# Patient Record
Sex: Female | Born: 2000 | Race: Black or African American | Hispanic: No | Marital: Married | Smoking: Never smoker
Health system: Southern US, Community
[De-identification: ages and names within clinical notes are randomized; demographics above are authoritative.]

## PROBLEM LIST (undated history)

## (undated) DIAGNOSIS — J45909 Unspecified asthma, uncomplicated: Secondary | ICD-10-CM

## (undated) DIAGNOSIS — K589 Irritable bowel syndrome without diarrhea: Secondary | ICD-10-CM

## (undated) HISTORY — PX: WISDOM TOOTH EXTRACTION: SHX21

---
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
## 2019-05-31 ENCOUNTER — Encounter (HOSPITAL_COMMUNITY): Payer: Self-pay | Admitting: Emergency Medicine

## 2019-05-31 ENCOUNTER — Emergency Department (HOSPITAL_COMMUNITY)
Admission: EM | Admit: 2019-05-31 | Discharge: 2019-05-31 | Disposition: A | Discharge status: Home / Self Care | Payer: BC Managed Care – PPO | Attending: Emergency Medicine | Admitting: Emergency Medicine

## 2019-05-31 ENCOUNTER — Emergency Department (HOSPITAL_COMMUNITY): Payer: BC Managed Care – PPO

## 2019-05-31 ENCOUNTER — Other Ambulatory Visit: Payer: Self-pay

## 2019-05-31 DIAGNOSIS — R1031 Right lower quadrant pain: Secondary | ICD-10-CM

## 2019-05-31 DIAGNOSIS — Z881 Allergy status to other antibiotic agents status: Secondary | ICD-10-CM | POA: Insufficient documentation

## 2019-05-31 DIAGNOSIS — Z79899 Other long term (current) drug therapy: Secondary | ICD-10-CM | POA: Diagnosis not present

## 2019-05-31 DIAGNOSIS — B373 Candidiasis of vulva and vagina: Secondary | ICD-10-CM | POA: Diagnosis not present

## 2019-05-31 DIAGNOSIS — N898 Other specified noninflammatory disorders of vagina: Secondary | ICD-10-CM

## 2019-05-31 DIAGNOSIS — Z880 Allergy status to penicillin: Secondary | ICD-10-CM | POA: Insufficient documentation

## 2019-05-31 DIAGNOSIS — J45909 Unspecified asthma, uncomplicated: Secondary | ICD-10-CM | POA: Insufficient documentation

## 2019-05-31 DIAGNOSIS — B379 Candidiasis, unspecified: Secondary | ICD-10-CM

## 2019-05-31 DIAGNOSIS — Z882 Allergy status to sulfonamides status: Secondary | ICD-10-CM | POA: Insufficient documentation

## 2019-05-31 DIAGNOSIS — R110 Nausea: Secondary | ICD-10-CM

## 2019-05-31 LAB — CBC
HCT: 35.5 % — ABNORMAL LOW (ref 36.0–46.0)
Hemoglobin: 10.6 g/dL — ABNORMAL LOW (ref 12.0–15.0)
MCH: 23 pg — ABNORMAL LOW (ref 26.0–34.0)
MCHC: 29.9 g/dL — ABNORMAL LOW (ref 30.0–36.0)
MCV: 77 fL — ABNORMAL LOW (ref 80.0–100.0)
Platelets: 413 10*3/uL — ABNORMAL HIGH (ref 150–400)
RBC: 4.61 MIL/uL (ref 3.87–5.11)
RDW: 17.5 % — ABNORMAL HIGH (ref 11.5–15.5)
WBC: 6.1 10*3/uL (ref 4.0–10.5)
nRBC: 0 % (ref 0.0–0.2)

## 2019-05-31 LAB — COMPREHENSIVE METABOLIC PANEL
ALT: 17 U/L (ref 0–44)
AST: 18 U/L (ref 15–41)
Albumin: 3.7 g/dL (ref 3.5–5.0)
Alkaline Phosphatase: 61 U/L (ref 38–126)
Anion gap: 8 (ref 5–15)
BUN: 10 mg/dL (ref 6–20)
CO2: 25 mmol/L (ref 22–32)
Calcium: 8.9 mg/dL (ref 8.9–10.3)
Chloride: 104 mmol/L (ref 98–111)
Creatinine, Ser: 0.55 mg/dL (ref 0.44–1.00)
GFR calc Af Amer: >60 mL/min (ref >60)
GFR calc non Af Amer: >60 mL/min (ref >60)
Glucose, Bld: 101 mg/dL — ABNORMAL HIGH (ref 70–99)
Potassium: 3.8 mmol/L (ref 3.5–5.1)
Sodium: 137 mmol/L (ref 135–145)
Total Bilirubin: 0.4 mg/dL (ref 0.3–1.2)
Total Protein: 7.4 g/dL (ref 6.5–8.1)

## 2019-05-31 LAB — URINALYSIS, ROUTINE W REFLEX MICROSCOPIC
Bilirubin Urine: NEGATIVE
Glucose, UA: NEGATIVE mg/dL
Hgb urine dipstick: NEGATIVE
Ketones, ur: NEGATIVE mg/dL
Leukocytes,Ua: NEGATIVE
Nitrite: NEGATIVE
Protein, ur: NEGATIVE mg/dL
Specific Gravity, Urine: 1.026 (ref 1.005–1.030)
pH: 6 (ref 5.0–8.0)

## 2019-05-31 LAB — WET PREP, GENITAL
Clue Cells Wet Prep HPF POC: NONE SEEN
Sperm: NONE SEEN
Trich, Wet Prep: NONE SEEN

## 2019-05-31 LAB — I-STAT BETA HCG BLOOD, ED (MC, WL, AP ONLY): I-stat hCG, quantitative: <5 m[IU]/mL (ref <5)

## 2019-05-31 LAB — LIPASE, BLOOD: Lipase: 23 U/L (ref 11–51)

## 2019-05-31 MED ORDER — SODIUM CHLORIDE 0.9% FLUSH: 3.0000 mL | Freq: Once | INTRAVENOUS | Status: DC

## 2019-05-31 MED ORDER — IOHEXOL 300 MG/ML  SOLN
100.0000 mL | Freq: Once | INTRAMUSCULAR | Status: AC | PRN
  Administered 2019-05-31: 100 mL via INTRAVENOUS

## 2019-05-31 MED ORDER — FLUCONAZOLE 150 MG PO TABS: ORAL_TABLET | ORAL | 0 refills | Status: AC

## 2019-05-31 MED ORDER — SODIUM CHLORIDE 0.9 % IV BOLUS
1000.0000 mL | Freq: Once | INTRAVENOUS | Status: AC
  Administered 2019-05-31: 1000 mL via INTRAVENOUS

## 2019-05-31 NOTE — Discharge Instructions (Addendum)
Your labwork and imaging were reassuring today. You do have a yeast infection today - please take medication as prescribed. If you continue to have discharge 3 days after taking the first dose please take the second dose.   Please follow up with your PCP for reevaluation in about 1 week.   Return to the ED for any worsening symptoms.

## 2019-05-31 NOTE — ED Provider Notes (Signed)
[facility] Note   CSN: [id number] Arrival date & time: 05/31/19  1431     History Chief Complaint  Patient presents with  . Abdominal Pain  . Nausea    Meghan Bailey is a 19 y.o. female who presents to the ED today complaining of sudden onset, constant, dull/achy, 7/10, RLQ abdominal pain that began this morning around 7:30 AM. Pt also complains of nausea, no emesis. Pt went to Urgent Care earlier today and was sent here for further evaluation with concern for possible appendicitis. Pt late ate/drank around 10-11 AM this morning. No previous abdominal surgeries. Denies fever, chills, diarrhea, constipation, urinary sx, pelvic pain. She does mention noticing thin clear vaginal discharge yesterday but none today. Pt is not sexually active, reports being a virgin.   The history is provided by the patient and medical records.       Past Medical History:  Diagnosis Date  . Asthma   . IBS (irritable bowel syndrome)     There are no problems to display for this patient.   History reviewed. No pertinent surgical history.   OB History   No obstetric history on file.     No family history on file.  Social History   Tobacco Use  . Smoking status: Never Smoker  . Smokeless tobacco: Never Used  Substance Use Topics  . Alcohol use: Never  . Drug use: Never    Home Medications Prior to Admission medications   Medication Sig Start Date End Date Taking? Authorizing Provider  hydrOXYzine (ATARAX/VISTARIL) 10 MG tablet Take 10 mg by mouth 3 (three) times daily as needed for anxiety.   Yes [provider]  sertraline (ZOLOFT) 100 MG tablet Take 100 mg by mouth daily.   Yes [provider]  fluconazole (DIFLUCAN) 150 MG tablet Take 1 tablet by mouth on day 1. If you continue to have symptoms take 1 tablet by mouth on day 4. 05/31/19   Hyman Hopes, Katelee Schupp, PA-C  loratadine (CLARITIN) 10 MG tablet Take 1 tablet (10 mg  total) by mouth daily. Patient not taking: Reported on 05/31/2019 07/17/17   Janne Napoleon, NP  ondansetron (ZOFRAN ODT) 4 MG disintegrating tablet Take 1 tablet (4 mg total) by mouth every 8 (eight) hours as needed for nausea or vomiting. Patient not taking: Reported on 05/31/2019 12/29/17   Janne Napoleon, NP  ranitidine (ZANTAC) 150 MG tablet Take 1 tablet (150 mg total) by mouth 2 (two) times daily. Patient not taking: Reported on 05/31/2019 12/29/17   Janne Napoleon, NP    Allergies    Clindamycin/lincomycin, Keflex [cephalexin], Tessalon [benzonatate], Penicillins, and Sulfa antibiotics  Review of Systems   Review of Systems  Constitutional: Negative for chills and fever.  Respiratory: Negative for cough and shortness of breath.   Cardiovascular: Negative for chest pain.  Gastrointestinal: Positive for abdominal pain and nausea. Negative for constipation, diarrhea and vomiting.  Genitourinary: Positive for vaginal discharge (resolved). Negative for difficulty urinating, flank pain, frequency and pelvic pain.  All other systems reviewed and are negative.   Physical Exam Updated Vital Signs BP 134/80 (BP Location: Right Arm)   Pulse 79   Temp 98.3 F (36.8 C) (Oral)   Resp 19   SpO2 100%   Physical Exam Vitals and nursing note reviewed.  Constitutional:      Appearance: She is not ill-appearing or diaphoretic.  HENT:     Head: Normocephalic and atraumatic.  Eyes:  Conjunctiva/sclera: Conjunctivae normal.  Cardiovascular:     Rate and Rhythm: Normal rate and regular rhythm.     Heart sounds: Normal heart sounds.  Pulmonary:     Effort: Pulmonary effort is normal.     Breath sounds: Normal breath sounds. No wheezing or rhonchi.  Chest:     Chest wall: No tenderness.  Abdominal:     General: Abdomen is flat.     Palpations: Abdomen is soft.     Tenderness: There is abdominal tenderness in the right lower quadrant. There is no right CVA tenderness, left CVA tenderness,  guarding or rebound. Positive signs include Rovsing's sign and McBurney's sign. Negative signs include psoas sign and obturator sign.  Genitourinary:    Comments: Chaperone present for exam Vivia Ewing, NT No rashes, lesions, or tenderness to external genitalia. No erythema, injury, or tenderness to vaginal mucosa. Thin white discharge noted in vault. + Right adnexal TTP. No CMT, cervical friability, or discharge from cervical os. Cervical os is closed. Uterus non-deviated, mobile, nonTTP, and without enlargement.  Musculoskeletal:     Cervical back: Neck supple.  Skin:    General: Skin is warm and dry.  Neurological:     Mental Status: She is alert.     ED Results / Procedures / Treatments   Labs (all labs ordered are listed, but only abnormal results are displayed) Labs Reviewed  WET PREP, GENITAL - Abnormal; Notable for the following components:      Result Value   Yeast Wet Prep HPF POC PRESENT (*)    WBC, Wet Prep HPF POC FEW (*)    All other components within normal limits  COMPREHENSIVE METABOLIC PANEL - Abnormal; Notable for the following components:   Glucose, Bld 101 (*)    All other components within normal limits  CBC - Abnormal; Notable for the following components:   Hemoglobin 10.6 (*)    HCT 35.5 (*)    MCV 77.0 (*)    MCH 23.0 (*)    MCHC 29.9 (*)    RDW 17.5 (*)    Platelets 413 (*)    All other components within normal limits  LIPASE, BLOOD  URINALYSIS, ROUTINE W REFLEX MICROSCOPIC  I-STAT BETA HCG BLOOD, ED ([facility], [facility], [facility] ONLY)  GC/CHLAMYDIA PROBE AMP ([place]) NOT AT [facility]    EKG None  Radiology CT Abdomen Pelvis W Contrast  Result Date: 05/31/2019 CLINICAL DATA:  Right lower quadrant abdominal pain since this morning EXAM: CT ABDOMEN AND PELVIS WITH CONTRAST TECHNIQUE: Multidetector CT imaging of the abdomen and pelvis was performed using the standard protocol following bolus administration of intravenous contrast. CONTRAST:  OMNIPAQUE  IOHEXOL 300 MG/ML  SOLN COMPARISON:  None. FINDINGS: Lower chest: No acute pleural or parenchymal lung disease. Hepatobiliary: No focal liver abnormality is seen. No gallstones, gallbladder wall thickening, or biliary dilatation. Pancreas: Unremarkable. No pancreatic ductal dilatation or surrounding inflammatory changes. Spleen: Normal in size without focal abnormality. Adrenals/Urinary Tract: Adrenal glands are unremarkable. Kidneys are normal, without renal calculi, focal lesion, or hydronephrosis. Bladder is unremarkable. Stomach/Bowel: Stomach is within normal limits. Appendix appears normal. No evidence of bowel wall thickening, distention, or inflammatory changes. Vascular/Lymphatic: No significant vascular findings are present. No enlarged abdominal or pelvic lymph nodes. Reproductive: Uterus and bilateral adnexa are unremarkable. Other: Trace pelvic free fluid is likely physiologic. No evidence of abdominal wall hernia. Musculoskeletal: No acute or destructive bony lesions. Reconstructed images demonstrate no additional findings. IMPRESSION: 1. No acute intra-abdominal or  intrapelvic process. Normal appendix. 2. Trace pelvic free fluid is likely physiologic in this premenopausal patient. Electronically Signed   By: Sharlet Salina M.D.   On: 05/31/2019 17:47   US PELVIC COMPLETE W TRANSVAGINAL AND TORSION R/O  Result Date: 05/31/2019 CLINICAL DATA:  Right lower quadrant pain x1 day EXAM: TRANSABDOMINAL AND TRANSVAGINAL ULTRASOUND OF PELVIS DOPPLER ULTRASOUND OF OVARIES TECHNIQUE: Both transabdominal and transvaginal ultrasound examinations of the pelvis were performed. Transabdominal technique was performed for global imaging of the pelvis including uterus, ovaries, adnexal regions, and pelvic cul-de-sac. It was necessary to proceed with endovaginal exam following the transabdominal exam to visualize the endometrium and right ovary. Color and duplex Doppler ultrasound was utilized to evaluate blood flow  to the ovaries. COMPARISON:  None. FINDINGS: Uterus Measurements: 8.9 x 4.9 x 5.3 cm = volume: 119 mL. No fibroids or other mass visualized. Endometrium Thickness: 15 mm.  No focal abnormality visualized. Right ovary Measurements: 5.0 x 2.7 x 3.0 cm = volume: 20.5 mL. Normal appearance/no adnexal mass. Left ovary Measurements: 2.7 x 2.1 x 2.6 cm = volume: 7.6 mL. Normal appearance/no adnexal mass. Pulsed Doppler evaluation of both ovaries demonstrates normal low-resistance arterial and venous waveforms. Other findings No abnormal free fluid. IMPRESSION: Negative pelvic ultrasound. No evidence of ovarian torsion. Electronically Signed   By: Charline Bills M.D.   On: 05/31/2019 19:00    Procedures Procedures (including critical care time)  Medications Ordered in ED Medications  sodium chloride flush (NS) 0.9 % injection 3 mL (has no administration in time range)  sodium chloride 0.9 % bolus 1,000 mL (1,000 mLs Intravenous New Bag/Given (Non-Interop) 05/31/19 1720)  iohexol (OMNIPAQUE) 300 MG/ML solution 100 mL (100 mLs Intravenous Contrast Given 05/31/19 1731)    ED Course  I have reviewed the triage vital signs and the nursing notes.  Pertinent labs & imaging results that were available during my care of the patient were reviewed by me and considered in my medical decision making (see chart for details).  19 year old female who presents to the ED today sent from urgent care with concern for appendicitis. Pt has been having RLQ abd pain that began this morning with nausea, no vomiting. On arrival to the ED pt is afebrile, nontachycardic, and nontachypneic. Pt does have some RLQ TTP on exam with + mcburney's and rovsing's sign. Will obtain CT A/P at this time. Pt is not sexually active but does report vaginal discharge that she noticed yesterday, denies pelvic pain. If no acute findings on CT scan will perform pelvic exam and pelvic ultrasound.   CT scan negative for appendicitis. Pelvic exam  performed with vaginal discharge present; awaiting wet prep results.   Pelvic ultrasound without signs concerning for torsion. Wet prep positive for yeast - will treat accordingly. This may very well be causing pt's discomfort. She reports her abdominal pain has improved since being in the ED. Will have her follow up outpatient with her PCP for reevaluation in 1 week. Strict return precautions discussed. Pt is in agreement with plan and stable for discharge home.   Clinical Course as of May 30 1928  Mon May 31, 2019  1904 Yeast Wet Prep HPF POC(!): PRESENT [MV]    Clinical Course User Index [MV] Tanda Rockers, PA-C    This note was prepared using Dragon voice recognition software and may include unintentional dictation errors due to the inherent limitations of voice recognition software.  MDM Rules/Calculators/A&P  Final Clinical Impression(s) / ED Diagnoses Final diagnoses:  Yeast infection  Right lower quadrant abdominal pain  Nausea  Vaginal discharge    Rx / DC Orders ED Discharge Orders         Ordered    fluconazole (DIFLUCAN) 150 MG tablet     05/31/19 1928           Discharge Instructions     Your labwork and imaging were reassuring today. You do have a yeast infection today - please take medication as prescribed. If you continue to have discharge 3 days after taking the first dose please take the second dose.   Please follow up with your PCP for reevaluation in about 1 week.   Return to the ED for any worsening symptoms.        Eustaquio Maize, PA-C 05/31/19 [place], [place], MD 06/02/19 1724

## 2019-05-31 NOTE — ED Triage Notes (Signed)
Patient here from urgent care with complaints of right lower quad abd pain that started this morning. Nausea, no vomiting. Denies pregnancy.

## 2019-06-02 LAB — GC/CHLAMYDIA PROBE AMP (~~LOC~~) NOT AT ARMC
Chlamydia: NEGATIVE
Neisseria Gonorrhea: NEGATIVE

## 2019-06-21 ENCOUNTER — Ambulatory Visit: Payer: BC Managed Care – PPO | Attending: Internal Medicine

## 2019-06-21 DIAGNOSIS — Z23 Encounter for immunization: Secondary | ICD-10-CM | POA: Insufficient documentation

## 2019-06-21 NOTE — Progress Notes (Signed)
   Covid-19 Vaccination Clinic  Name:  Heide Brossart    MRN: [id number] DOB: Feb 22, 2001  06/21/2019  Ms. Nayak was observed post Covid-19 immunization for 15 minutes without incident. She was provided with Vaccine Information Sheet and instruction to access the V-Safe system.   Ms. Boghosian was instructed to call 911 with any severe reactions post vaccine: Marland Kitchen Difficulty breathing  . Swelling of face and throat  . A fast heartbeat  . A bad rash all over body  . Dizziness and weakness   Immunizations Administered    Name Date Dose VIS Date Route   Pfizer COVID-19 Vaccine 06/21/2019  9:32 AM 0.3 mL 03/26/2019 Intramuscular   Manufacturer: [employer], [employer]   Lot: [id number]   NDC: [address]-1

## 2019-07-21 ENCOUNTER — Ambulatory Visit: Payer: BC Managed Care – PPO | Attending: Internal Medicine

## 2019-07-21 DIAGNOSIS — Z23 Encounter for immunization: Secondary | ICD-10-CM

## 2019-07-21 NOTE — Progress Notes (Signed)
   Covid-19 Vaccination Clinic  Name:  Bo Rogue    MRN: [id number] DOB: [birth year]  07/21/2019  Ms. Seelye was observed post Covid-19 immunization for 15 minutes without incident. She was provided with Vaccine Information Sheet and instruction to access the V-Safe system.   Ms. Petre was instructed to call 911 with any severe reactions post vaccine: Marland Kitchen Difficulty breathing  . Swelling of face and throat  . A fast heartbeat  . A bad rash all over body  . Dizziness and weakness   Immunizations Administered    Name Date Dose VIS Date Route   Pfizer COVID-19 Vaccine 07/21/2019  2:06 PM 0.3 mL 03/26/2019 Intramuscular   Manufacturer: [employer], [employer]   Lot: [id number]   NDC: [address]-1

## 2019-08-03 ENCOUNTER — Other Ambulatory Visit: Payer: Self-pay

## 2019-08-03 ENCOUNTER — Encounter (HOSPITAL_COMMUNITY): Payer: Self-pay

## 2019-08-03 ENCOUNTER — Emergency Department (HOSPITAL_COMMUNITY): Payer: BC Managed Care – PPO

## 2019-08-03 ENCOUNTER — Emergency Department (HOSPITAL_COMMUNITY)
Admission: EM | Admit: 2019-08-03 | Discharge: 2019-08-03 | Disposition: A | Discharge status: Home / Self Care | Payer: BC Managed Care – PPO | Attending: Emergency Medicine | Admitting: Emergency Medicine

## 2019-08-03 DIAGNOSIS — J45909 Unspecified asthma, uncomplicated: Secondary | ICD-10-CM | POA: Insufficient documentation

## 2019-08-03 DIAGNOSIS — R1310 Dysphagia, unspecified: Secondary | ICD-10-CM | POA: Diagnosis present

## 2019-08-03 DIAGNOSIS — Z79899 Other long term (current) drug therapy: Secondary | ICD-10-CM | POA: Insufficient documentation

## 2019-08-03 DIAGNOSIS — J029 Acute pharyngitis, unspecified: Secondary | ICD-10-CM | POA: Diagnosis not present

## 2019-08-03 LAB — CBC WITH DIFFERENTIAL/PLATELET
Abs Immature Granulocytes: 0.03 10*3/uL (ref 0.00–0.07)
Basophils Absolute: 0.1 10*3/uL (ref 0.0–0.1)
Basophils Relative: 0 %
Eosinophils Absolute: 0 10*3/uL (ref 0.0–0.5)
Eosinophils Relative: 0 %
HCT: 33.8 % — ABNORMAL LOW (ref 36.0–46.0)
Hemoglobin: 10.4 g/dL — ABNORMAL LOW (ref 12.0–15.0)
Immature Granulocytes: 0 %
Lymphocytes Relative: 12 %
Lymphs Abs: 1.5 10*3/uL (ref 0.7–4.0)
MCH: 23.2 pg — ABNORMAL LOW (ref 26.0–34.0)
MCHC: 30.8 g/dL (ref 30.0–36.0)
MCV: 75.4 fL — ABNORMAL LOW (ref 80.0–100.0)
Monocytes Absolute: 0.7 10*3/uL (ref 0.1–1.0)
Monocytes Relative: 6 %
Neutro Abs: 9.6 10*3/uL — ABNORMAL HIGH (ref 1.7–7.7)
Neutrophils Relative %: 82 %
Platelets: 452 10*3/uL — ABNORMAL HIGH (ref 150–400)
RBC: 4.48 MIL/uL (ref 3.87–5.11)
RDW: 16.1 % — ABNORMAL HIGH (ref 11.5–15.5)
WBC: 11.9 10*3/uL — ABNORMAL HIGH (ref 4.0–10.5)
nRBC: 0 % (ref 0.0–0.2)

## 2019-08-03 LAB — BASIC METABOLIC PANEL
Anion gap: 10 (ref 5–15)
BUN: 7 mg/dL (ref 6–20)
CO2: 26 mmol/L (ref 22–32)
Calcium: 8.7 mg/dL — ABNORMAL LOW (ref 8.9–10.3)
Chloride: 103 mmol/L (ref 98–111)
Creatinine, Ser: 0.63 mg/dL (ref 0.44–1.00)
GFR calc Af Amer: >60 mL/min (ref >60)
GFR calc non Af Amer: >60 mL/min (ref >60)
Glucose, Bld: 101 mg/dL — ABNORMAL HIGH (ref 70–99)
Potassium: 3.7 mmol/L (ref 3.5–5.1)
Sodium: 139 mmol/L (ref 135–145)

## 2019-08-03 MED ORDER — DEXAMETHASONE SODIUM PHOSPHATE 10 MG/ML IJ SOLN
10.0000 mg | Freq: Once | INTRAMUSCULAR | Status: AC
  Administered 2019-08-03: 21:00:00 10 mg via INTRAVENOUS
  Filled 2019-08-03: qty 1

## 2019-08-03 MED ORDER — SODIUM CHLORIDE (PF) 0.9 % IJ SOLN
INTRAMUSCULAR | Status: AC
  Filled 2019-08-03: qty 50

## 2019-08-03 MED ORDER — SODIUM CHLORIDE 0.9 % IV BOLUS
1000.0000 mL | Freq: Once | INTRAVENOUS | Status: AC
  Administered 2019-08-03: 1000 mL via INTRAVENOUS

## 2019-08-03 MED ORDER — IOHEXOL 300 MG/ML  SOLN
100.0000 mL | Freq: Once | INTRAMUSCULAR | Status: AC | PRN
  Administered 2019-08-03: 100 mL via INTRAVENOUS

## 2019-08-03 NOTE — Discharge Instructions (Addendum)
Your CT scan shows normal postsurgical changes.  It is important to hydrate with water while you are at home.  However fluids given today in addition to the steroids will help improve your symptoms over the next 24 hours.  Return to the ER for worsening or concerning symptoms.

## 2019-08-03 NOTE — ED Triage Notes (Signed)
Patient states she had her wisdom teeth taken out 4 days ago. Patient has facial swelling. Patient states she is having difficulty swallowing on the right side. Patient talking in complete sentences and sats 1005.

## 2019-08-03 NOTE — ED Provider Notes (Signed)
[facility] DEPT Provider Note   CSN: [id number] Arrival date & time: 08/03/19  1546     History Chief Complaint  Patient presents with  . Dysphagia    Meghan Bailey is a 19 y.o. female.  19 year old female presents with complaint of right-sided sore throat with painful swallowing and difficulty opening her mouth.  Patient had her wisdom teeth removed 4 days ago, took a 4-day course of ibuprofen and hydrocodone, was not prescribed antibiotics.  Patient denies sick contacts, fevers, changes in voice or difficulty breathing or swallowing.  No other complaints or concerns.        Past Medical History:  Diagnosis Date  . Asthma   . IBS (irritable bowel syndrome)     There are no problems to display for this patient.   Past Surgical History:  Procedure Laterality Date  . WISDOM TOOTH EXTRACTION       OB History   No obstetric history on file.     Family History  Problem Relation Age of Onset  . Hypertension Mother   . Hypertension Father     Social History   Tobacco Use  . Smoking status: Never Smoker  . Smokeless tobacco: Never Used  Substance Use Topics  . Alcohol use: Never  . Drug use: Never    Home Medications Prior to Admission medications   Medication Sig Start Date End Date Taking? Authorizing Provider  ibuprofen (ADVIL) 600 MG tablet Take 600 mg by mouth every 6 (six) hours as needed for moderate pain.   Yes [provider]  fluconazole (DIFLUCAN) 150 MG tablet Take 1 tablet by mouth on day 1. If you continue to have symptoms take 1 tablet by mouth on day 4. Patient not taking: Reported on 08/03/2019 05/31/19   Eustaquio Maize, PA-C  loratadine (CLARITIN) 10 MG tablet Take 1 tablet (10 mg total) by mouth daily. Patient not taking: Reported on 05/31/2019 07/17/17   Ashley Murrain, NP  ondansetron (ZOFRAN ODT) 4 MG disintegrating tablet Take 1 tablet (4 mg total) by mouth every 8 (eight) hours as needed for  nausea or vomiting. Patient not taking: Reported on 05/31/2019 12/29/17   Ashley Murrain, NP  ranitidine (ZANTAC) 150 MG tablet Take 1 tablet (150 mg total) by mouth 2 (two) times daily. Patient not taking: Reported on 05/31/2019 12/29/17   Ashley Murrain, NP    Allergies    Amoxicillin, Clindamycin/lincomycin, Keflex [cephalexin], Tessalon [benzonatate], Penicillins, and Sulfa antibiotics  Review of Systems   Review of Systems  Constitutional: Negative for chills and fever.  HENT: Positive for dental problem and sore throat. Negative for trouble swallowing and voice change.   Respiratory: Negative for cough and shortness of breath.   Gastrointestinal: Negative for nausea and vomiting.  Skin: Negative for rash and wound.  Allergic/Immunologic: Negative for immunocompromised state.  Hematological: Negative for adenopathy.  All other systems reviewed and are negative.   Physical Exam Updated Vital Signs BP 135/71   Pulse 96   Temp 98 F (36.7 C) (Oral)   Resp 16   Ht 5\' 6"  (1.676 m)   Wt 99.8 kg   LMP 07/15/2019   SpO2 100%   BMI 35.51 kg/m   Physical Exam Vitals and nursing note reviewed.  Constitutional:      General: She is not in acute distress.    Appearance: She is well-developed. She is not diaphoretic.  HENT:     Head: Normocephalic and atraumatic.  Jaw: Trismus present.     Comments: Found to have trismus, able to open mouth to 1 fingerbreadth.  Uvula is midline, no peritonsillar swelling.  No sublingual swelling or tenderness.  There is submandibular fullness on both sides.    Mouth/Throat:     Mouth: Mucous membranes are dry.     Pharynx: No oropharyngeal exudate or posterior oropharyngeal erythema.  Cardiovascular:     Rate and Rhythm: Normal rate and regular rhythm.     Pulses: Normal pulses.     Heart sounds: Normal heart sounds.  Pulmonary:     Effort: Pulmonary effort is normal.     Breath sounds: Normal breath sounds.  Musculoskeletal:     Cervical  back: Tenderness present.  Skin:    General: Skin is warm and dry.     Findings: No erythema or rash.  Neurological:     Mental Status: She is alert and oriented to person, place, and time.  Psychiatric:        Behavior: Behavior normal.     ED Results / Procedures / Treatments   Labs (all labs ordered are listed, but only abnormal results are displayed) Labs Reviewed  CBC WITH DIFFERENTIAL/PLATELET - Abnormal; Notable for the following components:      Result Value   WBC 11.9 (*)    Hemoglobin 10.4 (*)    HCT 33.8 (*)    MCV 75.4 (*)    MCH 23.2 (*)    RDW 16.1 (*)    Platelets 452 (*)    Neutro Abs 9.6 (*)    All other components within normal limits  BASIC METABOLIC PANEL - Abnormal; Notable for the following components:   Glucose, Bld 101 (*)    Calcium 8.7 (*)    All other components within normal limits    EKG None  Radiology DG Neck Soft Tissue  Result Date: 08/03/2019 CLINICAL DATA:  19 year old female with difficulty swallowing. Status post dental extraction. EXAM: NECK SOFT TISSUES - 1+ VIEW COMPARISON:  None. FINDINGS: There is no evidence of retropharyngeal soft tissue swelling or epiglottic enlargement. The cervical airway is unremarkable and no radio-opaque foreign body identified. IMPRESSION: Negative. Electronically Signed   By: Elgie Collard M.D.   On: 08/03/2019 18:32   CT Soft Tissue Neck W Contrast  Result Date: 08/03/2019 CLINICAL DATA:  Painful swallowing. Recent dental extraction. EXAM: CT NECK WITH CONTRAST TECHNIQUE: Multidetector CT imaging of the neck was performed using the standard protocol following the bolus administration of intravenous contrast. CONTRAST:  OMNIPAQUE IOHEXOL 300 MG/ML  SOLN COMPARISON:  None. FINDINGS: Pharynx and larynx: Normal. No mass or swelling. Salivary glands: Right submandibular gland is enlarged. There is mild adjacent inflammatory stranding. Thyroid: Normal Lymph nodes: No enlarged or abnormal density nodes  Vascular: Normal Limited intracranial: Normal Visualized orbits: Normal Mastoids and visualized paranasal sinuses: Left maxillary sinus retention cyst Skeleton: No acute or aggressive process. Upper chest: Negative. Other: Postsurgical changes recent extraction of the third maxillary and mandibular molars on the right and the third maxillary molar on the left. IMPRESSION: Enlarged right submandibular gland with mild adjacent inflammatory stranding, likely indicating mild cellulitis in the setting of recent third molar extractions. No abscess or drainable fluid collection. Electronically Signed   By: Deatra Robinson M.D.   On: 08/03/2019 23:25    Procedures Procedures (including critical care time)  Medications Ordered in ED Medications  sodium chloride (PF) 0.9 % injection (has no administration in time range)  sodium chloride 0.9 %  bolus 1,000 mL (0 mLs Intravenous Stopped 08/03/19 2331)  dexamethasone (DECADRON) injection 10 mg (10 mg Intravenous Given 08/03/19 2046)  iohexol (OMNIPAQUE) 300 MG/ML solution 100 mL (100 mLs Intravenous Contrast Given 08/03/19 2257)    ED Course  I have reviewed the triage vital signs and the nursing notes.  Pertinent labs & imaging results that were available during my care of the patient were reviewed by me and considered in my medical decision making (see chart for details).  Clinical Course as of Aug 03 2343  Tue Aug 03, 2019  1871 19 year old female presents with complaint of sore throat and swelling post dental extraction 4 days ago. On exam patient does have trismus, able to open mouth one finger with. There is no sublingual swelling, she does seem to have submandibular fullness, no evidence of peritonsillar abscess. Patient was given IV fluids and Decadron. Labs with mildly increased white count 11.9, hemoglobin of 10.4, appears chronic. BMP unremarkable. Patient is afebrile and tolerating secretions. CT with postop changes, no abscess at this time. On  recheck, patient is feeling somewhat improved after fluids and Decadron. Results were discussed with patient's mother with her permission. Recommend home to rest and hydrate, take Motrin Tylenol, if unable to swallow pills may use children's liquid preparation. Return to ER for fever, difficulty swallowing/drooling, any worsening or concerning symptoms.   [LM]    Clinical Course User Index [LM] Alden Hipp   MDM Rules/Calculators/A&P                      Final Clinical Impression(s) / ED Diagnoses Final diagnoses:  Pharyngitis, unspecified etiology    Rx / DC Orders ED Discharge Orders    None       Alden Hipp 08/03/19 2345    Bethann Berkshire, MD 08/05/19 0021

## 2019-12-15 ENCOUNTER — Other Ambulatory Visit: Payer: Medicaid Other

## 2020-02-16 ENCOUNTER — Ambulatory Visit: Payer: Medicaid Other | Attending: Family

## 2020-02-16 DIAGNOSIS — Z23 Encounter for immunization: Secondary | ICD-10-CM

## 2020-03-05 ENCOUNTER — Other Ambulatory Visit: Payer: Self-pay

## 2020-03-05 ENCOUNTER — Emergency Department (HOSPITAL_COMMUNITY): Payer: BC Managed Care – PPO

## 2020-03-05 ENCOUNTER — Emergency Department (HOSPITAL_COMMUNITY)
Admission: EM | Admit: 2020-03-05 | Discharge: 2020-03-05 | Disposition: A | Discharge status: Home / Self Care | Payer: BC Managed Care – PPO | Attending: Emergency Medicine | Admitting: Emergency Medicine

## 2020-03-05 ENCOUNTER — Encounter (HOSPITAL_COMMUNITY): Payer: Self-pay | Admitting: Emergency Medicine

## 2020-03-05 DIAGNOSIS — K589 Irritable bowel syndrome without diarrhea: Secondary | ICD-10-CM | POA: Insufficient documentation

## 2020-03-05 DIAGNOSIS — R1011 Right upper quadrant pain: Secondary | ICD-10-CM | POA: Diagnosis not present

## 2020-03-05 DIAGNOSIS — Z881 Allergy status to other antibiotic agents status: Secondary | ICD-10-CM | POA: Insufficient documentation

## 2020-03-05 DIAGNOSIS — R110 Nausea: Secondary | ICD-10-CM | POA: Diagnosis not present

## 2020-03-05 DIAGNOSIS — Z79899 Other long term (current) drug therapy: Secondary | ICD-10-CM | POA: Diagnosis not present

## 2020-03-05 DIAGNOSIS — Z880 Allergy status to penicillin: Secondary | ICD-10-CM | POA: Insufficient documentation

## 2020-03-05 DIAGNOSIS — Z882 Allergy status to sulfonamides status: Secondary | ICD-10-CM | POA: Diagnosis not present

## 2020-03-05 DIAGNOSIS — R1013 Epigastric pain: Secondary | ICD-10-CM | POA: Diagnosis not present

## 2020-03-05 DIAGNOSIS — D649 Anemia, unspecified: Secondary | ICD-10-CM | POA: Insufficient documentation

## 2020-03-05 LAB — URINALYSIS, ROUTINE W REFLEX MICROSCOPIC
Bacteria, UA: NONE SEEN
Bilirubin Urine: NEGATIVE
Glucose, UA: NEGATIVE mg/dL
Ketones, ur: NEGATIVE mg/dL
Leukocytes,Ua: NEGATIVE
Nitrite: NEGATIVE
Protein, ur: NEGATIVE mg/dL
Specific Gravity, Urine: 1.015 (ref 1.005–1.030)
pH: 6 (ref 5.0–8.0)

## 2020-03-05 LAB — CBC
HCT: 33.7 % — ABNORMAL LOW (ref 36.0–46.0)
Hemoglobin: 10.1 g/dL — ABNORMAL LOW (ref 12.0–15.0)
MCH: 21.8 pg — ABNORMAL LOW (ref 26.0–34.0)
MCHC: 30 g/dL (ref 30.0–36.0)
MCV: 72.8 fL — ABNORMAL LOW (ref 80.0–100.0)
Platelets: 419 10*3/uL — ABNORMAL HIGH (ref 150–400)
RBC: 4.63 MIL/uL (ref 3.87–5.11)
RDW: 18.2 % — ABNORMAL HIGH (ref 11.5–15.5)
WBC: 5.7 10*3/uL (ref 4.0–10.5)
nRBC: 0 % (ref 0.0–0.2)

## 2020-03-05 LAB — I-STAT BETA HCG BLOOD, ED (MC, WL, AP ONLY): I-stat hCG, quantitative: <5 m[IU]/mL (ref <5)

## 2020-03-05 LAB — COMPREHENSIVE METABOLIC PANEL
ALT: 16 U/L (ref 0–44)
AST: 21 U/L (ref 15–41)
Albumin: 4 g/dL (ref 3.5–5.0)
Alkaline Phosphatase: 65 U/L (ref 38–126)
Anion gap: 3 — ABNORMAL LOW (ref 5–15)
BUN: 10 mg/dL (ref 6–20)
CO2: 27 mmol/L (ref 22–32)
Calcium: 9.1 mg/dL (ref 8.9–10.3)
Chloride: 106 mmol/L (ref 98–111)
Creatinine, Ser: 0.66 mg/dL (ref 0.44–1.00)
GFR, Estimated: >60 mL/min (ref >60)
Glucose, Bld: 91 mg/dL (ref 70–99)
Potassium: 3.9 mmol/L (ref 3.5–5.1)
Sodium: 136 mmol/L (ref 135–145)
Total Bilirubin: 0.4 mg/dL (ref 0.3–1.2)
Total Protein: 7.8 g/dL (ref 6.5–8.1)

## 2020-03-05 LAB — LIPASE, BLOOD: Lipase: 34 U/L (ref 11–51)

## 2020-03-05 MED ORDER — ONDANSETRON 4 MG PO TBDP: 4.0000 mg | ORAL_TABLET | Freq: Three times a day (TID) | ORAL | 0 refills | Status: DC | PRN

## 2020-03-05 MED ORDER — ONDANSETRON 4 MG PO TBDP
4.0000 mg | ORAL_TABLET | Freq: Once | ORAL | Status: AC
  Administered 2020-03-05: 4 mg via ORAL
  Filled 2020-03-05: qty 1

## 2020-03-05 MED ORDER — ALUM & MAG HYDROXIDE-SIMETH 200-200-20 MG/5ML PO SUSP
30.0000 mL | Freq: Once | ORAL | Status: AC
  Administered 2020-03-05: 30 mL via ORAL
  Filled 2020-03-05: qty 30

## 2020-03-05 MED ORDER — FAMOTIDINE 20 MG PO TABS: 20.0000 mg | ORAL_TABLET | Freq: Two times a day (BID) | ORAL | 0 refills | Status: AC

## 2020-03-05 NOTE — ED Triage Notes (Signed)
Patient here from home reporting abd pain x4 days above umbilicus. Nausea, no vomiting.

## 2020-03-05 NOTE — ED Provider Notes (Signed)
[facility]-EMERGENCY DEPT Provider Note   CSN: [id number] Arrival date & time: 03/05/20  1324     History Chief Complaint  Patient presents with  . Abdominal Pain  . Nausea    Meghan Bailey is a 19 y.o. female.  The history is provided by the patient and medical records.  Abdominal Pain  Meghan Bailey is a 19 y.o. female who presents to the Emergency Department complaining of abdominal pain.  She presents to the ED complaining of four days of epigastric abdominal pain.   Pain is waxing and waning, worse during the day.  Episodes last hourly and are for 15-30 seconds at a time.  Pain radiates throughout the abdomen.  Has associated nausea, constipation and poor appetite.  No fever, cough, sob, vomiting, diarrhea, dysuria, vaginal discharge.  Has a hx/o IBS but this feels different. Sxs are gradually worsening.  No prior abdominal surgeries.      Past Medical History:  Diagnosis Date  . Asthma   . IBS (irritable bowel syndrome)     There are no problems to display for this patient.   Past Surgical History:  Procedure Laterality Date  . WISDOM TOOTH EXTRACTION       OB History   No obstetric history on file.     Family History  Problem Relation Age of Onset  . Hypertension Mother   . Hypertension Father     Social History   Tobacco Use  . Smoking status: Never Smoker  . Smokeless tobacco: Never Used  Vaping Use  . Vaping Use: Never used  Substance Use Topics  . Alcohol use: Never  . Drug use: Never    Home Medications Prior to Admission medications   Medication Sig Start Date End Date Taking? Authorizing Provider  famotidine (PEPCID) 20 MG tablet Take 1 tablet (20 mg total) by mouth 2 (two) times daily. 03/05/20   Tilden Fossa, MD  fluconazole (DIFLUCAN) 150 MG tablet Take 1 tablet by mouth on day 1. If you continue to have symptoms take 1 tablet by mouth on day 4. Patient not taking: Reported on 08/03/2019 05/31/19    Tanda Rockers, PA-C  ibuprofen (ADVIL) 600 MG tablet Take 600 mg by mouth every 6 (six) hours as needed for moderate pain.    [provider]  loratadine (CLARITIN) 10 MG tablet Take 1 tablet (10 mg total) by mouth daily. Patient not taking: Reported on 05/31/2019 07/17/17   Janne Napoleon, NP  ondansetron (ZOFRAN ODT) 4 MG disintegrating tablet Take 1 tablet (4 mg total) by mouth every 8 (eight) hours as needed for nausea or vomiting. 03/05/20   Tilden Fossa, MD  ranitidine (ZANTAC) 150 MG tablet Take 1 tablet (150 mg total) by mouth 2 (two) times daily. Patient not taking: Reported on 05/31/2019 12/29/17 03/05/20  Janne Napoleon, NP    Allergies    Amoxicillin, Clindamycin/lincomycin, Keflex [cephalexin], Tessalon [benzonatate], Penicillins, and Sulfa antibiotics  Review of Systems   Review of Systems  Gastrointestinal: Positive for abdominal pain.  All other systems reviewed and are negative.   Physical Exam Updated Vital Signs BP (!) 128/96 (BP Location: Right Arm)   Pulse 72   Temp 98.5 F (36.9 C) (Oral)   Resp (!) 21   SpO2 100%   Physical Exam Vitals and nursing note reviewed.  Constitutional:      Appearance: She is well-developed.  HENT:     Head: Normocephalic and atraumatic.  Cardiovascular:     Rate  and Rhythm: Normal rate and regular rhythm.     Heart sounds: No murmur heard.   Pulmonary:     Effort: Pulmonary effort is normal. No respiratory distress.     Breath sounds: Normal breath sounds.  Abdominal:     Palpations: Abdomen is soft.     Tenderness: There is no guarding or rebound.     Comments: Moderate epigastric and RUQ tenderness  Musculoskeletal:        General: No tenderness.  Skin:    General: Skin is warm and dry.  Neurological:     Mental Status: She is alert and oriented to person, place, and time.  Psychiatric:        Behavior: Behavior normal.     ED Results / Procedures / Treatments   Labs (all labs ordered are listed, but  only abnormal results are displayed) Labs Reviewed  COMPREHENSIVE METABOLIC PANEL - Abnormal; Notable for the following components:      Result Value   Anion gap 3 (*)    All other components within normal limits  CBC - Abnormal; Notable for the following components:   Hemoglobin 10.1 (*)    HCT 33.7 (*)    MCV 72.8 (*)    MCH 21.8 (*)    RDW 18.2 (*)    Platelets 419 (*)    All other components within normal limits  URINALYSIS, ROUTINE W REFLEX MICROSCOPIC - Abnormal; Notable for the following components:   Hgb urine dipstick SMALL (*)    All other components within normal limits  LIPASE, BLOOD  I-STAT BETA HCG BLOOD, ED ([facility], [facility], [facility] ONLY)    EKG None  Radiology US Abdomen Limited RUQ (LIVER/GB)  Result Date: 03/05/2020 CLINICAL DATA:  Right upper quadrant pain EXAM: ULTRASOUND ABDOMEN LIMITED RIGHT UPPER QUADRANT COMPARISON:  CT 05/31/2019 FINDINGS: Gallbladder: No gallstones or wall thickening visualized. No sonographic Murphy sign noted by sonographer. Common bile duct: Diameter: 4 mm Liver: No focal lesion identified. Within normal limits in parenchymal echogenicity. Portal vein is patent on color Doppler imaging with normal direction of blood flow towards the liver. Other: None. IMPRESSION: Negative right upper quadrant abdominal ultrasound Electronically Signed   By: Jasmine Pang M.D.   On: 03/05/2020 18:33    Procedures Procedures (including critical care time)  Medications Ordered in ED Medications  alum & mag hydroxide-simeth (MAALOX/MYLANTA) 200-200-20 MG/5ML suspension 30 mL (30 mLs Oral Given 03/05/20 1658)  ondansetron (ZOFRAN-ODT) disintegrating tablet 4 mg (4 mg Oral Given 03/05/20 1658)    ED Course  I have reviewed the triage vital signs and the nursing notes.  Pertinent labs & imaging results that were available during my care of the patient were reviewed by me and considered in my medical decision making (see chart for details).    MDM  Rules/Calculators/A&P                         Pt here for four days of epigastric abdominal pain.  She has epigastric and RUQ tenderness on exam without peritoneal findings.  CMP wnl. CBC with stable anemia.  UA not c/w UTI.  RUQ [place] is neg for cholelithiasis.  Sxs not c/w cholecystitis, pancreatitis, appendicitis, perforated viscous. She is feeling improved on recheck following treatment with GI cocktail.  Plan to d/c home with treatment for reflux/gastritis with outpatient follow up and return precautions.    Final Clinical Impression(s) / ED Diagnoses Final diagnoses:  RUQ abdominal pain  Epigastric  pain    Rx / DC Orders ED Discharge Orders         Ordered    ondansetron (ZOFRAN ODT) 4 MG disintegrating tablet  Every 8 hours PRN        03/05/20 1856    famotidine (PEPCID) 20 MG tablet  2 times daily        03/05/20 1856           Tilden Fossa, MD 03/05/20 2028

## 2020-04-09 ENCOUNTER — Ambulatory Visit (HOSPITAL_COMMUNITY)
Admission: EM | Admit: 2020-04-09 | Discharge: 2020-04-09 | Disposition: A | Discharge status: Home / Self Care | Payer: BC Managed Care – PPO | Attending: Emergency Medicine | Admitting: Emergency Medicine

## 2020-04-09 ENCOUNTER — Other Ambulatory Visit: Payer: Self-pay

## 2020-04-09 ENCOUNTER — Encounter (HOSPITAL_COMMUNITY): Payer: Self-pay

## 2020-04-09 DIAGNOSIS — R1030 Lower abdominal pain, unspecified: Secondary | ICD-10-CM | POA: Insufficient documentation

## 2020-04-09 DIAGNOSIS — R350 Frequency of micturition: Secondary | ICD-10-CM | POA: Diagnosis not present

## 2020-04-09 DIAGNOSIS — N898 Other specified noninflammatory disorders of vagina: Secondary | ICD-10-CM | POA: Insufficient documentation

## 2020-04-09 DIAGNOSIS — Z3202 Encounter for pregnancy test, result negative: Secondary | ICD-10-CM

## 2020-04-09 DIAGNOSIS — R109 Unspecified abdominal pain: Secondary | ICD-10-CM | POA: Diagnosis not present

## 2020-04-09 LAB — POCT URINALYSIS DIPSTICK, ED / UC
Bilirubin Urine: NEGATIVE
Glucose, UA: NEGATIVE mg/dL
Ketones, ur: NEGATIVE mg/dL
Leukocytes,Ua: NEGATIVE
Nitrite: NEGATIVE
Protein, ur: NEGATIVE mg/dL
Specific Gravity, Urine: >=1.03 (ref 1.005–1.030)
Urobilinogen, UA: 0.2 mg/dL (ref 0.0–1.0)
pH: 6 (ref 5.0–8.0)

## 2020-04-09 LAB — POC URINE PREG, ED: Preg Test, Ur: NEGATIVE

## 2020-04-09 MED ORDER — MELOXICAM 7.5 MG PO TABS: 7.5000 mg | ORAL_TABLET | Freq: Every day | ORAL | 0 refills | Status: AC

## 2020-04-09 NOTE — Discharge Instructions (Signed)

## 2020-04-09 NOTE — ED Triage Notes (Signed)
Pt in with c/o urinary frequency, vaginal discharge and right abdominal pain that has been going on since Friday  Denies fever

## 2020-04-09 NOTE — ED Provider Notes (Signed)
[facility]   MRN: [id number] DOB: [birth year]  Subjective:   Meghan Bailey is a 19 y.o. female presenting for 3-day history of acute onset discharge mild right lower sided belly pain.  LMP was 2 weeks ago.  Patient is sexually active.  Drinks a lot of coffee, carbonated drinks water.  No current facility-administered medications for this encounter.  Current Outpatient Medications:    famotidine (PEPCID) 20 MG tablet, Take 1 tablet (20 mg total) by mouth 2 (two) times daily., Disp: 30 tablet, Rfl: 0   fluconazole (DIFLUCAN) 150 MG tablet, Take 1 tablet by mouth on day 1. If you continue to have symptoms take 1 tablet by mouth on day 4. (Patient not taking: Reported on 08/03/2019), Disp: 2 tablet, Rfl: 0   ibuprofen (ADVIL) 600 MG tablet, Take 600 mg by mouth every 6 (six) hours as needed for moderate pain., Disp: , Rfl:    loratadine (CLARITIN) 10 MG tablet, Take 1 tablet (10 mg total) by mouth daily. (Patient not taking: Reported on 05/31/2019), Disp: 14 tablet, Rfl: 0   ondansetron (ZOFRAN ODT) 4 MG disintegrating tablet, Take 1 tablet (4 mg total) by mouth every 8 (eight) hours as needed for nausea or vomiting., Disp: 8 tablet, Rfl: 0   Allergies  Allergen Reactions   Amoxicillin Anaphylaxis   Clindamycin/Lincomycin Anaphylaxis    "closes her throat up"   Keflex [Cephalexin] Anaphylaxis   Tessalon [Benzonatate] Anaphylaxis   Penicillins Itching and Rash   Sulfa Antibiotics Hives and Rash    Past Medical History:  Diagnosis Date   Asthma    IBS (irritable bowel syndrome)      Past Surgical History:  Procedure Laterality Date   WISDOM TOOTH EXTRACTION      Family History  Problem Relation Age of Onset   Hypertension Mother    Hypertension Father     Social History   Tobacco Use   Smoking status: Never Smoker   Smokeless tobacco: Never Used  Vaping Use   Vaping Use: Never used  Substance Use Topics   Alcohol use: Never    Drug use: Never    ROS   Objective:   Vitals: BP 108/64 (BP Location: Left Arm)    Pulse 88    Temp 98.4 F (36.9 C)    Resp 19    LMP 03/28/2020 (Approximate)    SpO2 99%   Physical Exam Constitutional:      General: She is not in acute distress.    Appearance: Normal appearance. She is well-developed. She is obese. She is not ill-appearing, toxic-appearing or diaphoretic.  HENT:     Head: Normocephalic and atraumatic.     Nose: Nose normal.     Mouth/Throat:     Mouth: Mucous membranes are moist.     Pharynx: Oropharynx is clear.  Eyes:     General: No scleral icterus.    Extraocular Movements: Extraocular movements intact.     Pupils: Pupils are equal, round, and reactive to light.  Cardiovascular:     Rate and Rhythm: Normal rate.  Pulmonary:     Effort: Pulmonary effort is normal.  Abdominal:     General: Bowel sounds are normal. There is no distension.     Palpations: Abdomen is soft.     Tenderness: There is no abdominal tenderness. There is no right CVA tenderness, left CVA tenderness, guarding or rebound.  Skin:    General: Skin is warm and dry.  Neurological:  General: No focal deficit present.     Mental Status: She is alert and oriented to person, place, and time.  Psychiatric:        Mood and Affect: Mood normal.        Behavior: Behavior normal.     Results for orders placed or performed during the hospital encounter of 04/09/20 (from the past 24 hour(s))  POCT Urinalysis Dipstick (ED/UC)     Status: Abnormal   Collection Time: 04/09/20  3:37 PM  Result Value Ref Range   Glucose, UA NEGATIVE NEGATIVE mg/dL   Bilirubin Urine NEGATIVE NEGATIVE   Ketones, ur NEGATIVE NEGATIVE mg/dL   Specific Gravity, Urine >=1.030 1.005 - 1.030   Hgb urine dipstick TRACE (A) NEGATIVE   pH 6.0 5.0 - 8.0   Protein, ur NEGATIVE NEGATIVE mg/dL   Urobilinogen, UA 0.2 0.0 - 1.0 mg/dL   Nitrite NEGATIVE NEGATIVE   Leukocytes,Ua NEGATIVE NEGATIVE  POC urine preg, ED  (not at [facility])     Status: None   Collection Time: 04/09/20  3:46 PM  Result Value Ref Range   Preg Test, Ur NEGATIVE NEGATIVE    Assessment and Plan :   PDMP not reviewed this encounter.  1. Urinary frequency   2. Vaginal discharge   3. Lower abdominal pain     Patient prefers to hold off on empiric treatment would like to wait for results to baseline medication prescriptions.  She does not improve with water and counseled that this could be a source of urinary frequency.  Labs pending, will will treat as appropriate, okay to use meloxicam for pain control. Counseled patient on potential for adverse effects with medications prescribed/recommended today, ER and return-to-clinic precautions discussed, patient verbalized understanding.    Wallis Bamberg, [place] 04/09/20 1641

## 2020-04-10 LAB — CERVICOVAGINAL ANCILLARY ONLY
Bacterial Vaginitis (gardnerella): NEGATIVE
Candida Glabrata: NEGATIVE
Candida Vaginitis: NEGATIVE
Chlamydia: NEGATIVE
Comment: NEGATIVE
Comment: NEGATIVE
Comment: NEGATIVE
Comment: NEGATIVE
Comment: NEGATIVE
Comment: NORMAL
Neisseria Gonorrhea: NEGATIVE
Trichomonas: NEGATIVE

## 2020-04-14 ENCOUNTER — Emergency Department (HOSPITAL_COMMUNITY)
Admission: EM | Admit: 2020-04-14 | Discharge: 2020-04-15 | Disposition: A | Discharge status: Home / Self Care | Payer: BC Managed Care – PPO | Attending: Emergency Medicine | Admitting: Emergency Medicine

## 2020-04-14 ENCOUNTER — Other Ambulatory Visit: Payer: Self-pay

## 2020-04-14 DIAGNOSIS — R110 Nausea: Secondary | ICD-10-CM | POA: Insufficient documentation

## 2020-04-14 DIAGNOSIS — Z5321 Procedure and treatment not carried out due to patient leaving prior to being seen by health care provider: Secondary | ICD-10-CM | POA: Insufficient documentation

## 2020-04-14 DIAGNOSIS — R1031 Right lower quadrant pain: Secondary | ICD-10-CM | POA: Insufficient documentation

## 2020-04-14 LAB — URINALYSIS, ROUTINE W REFLEX MICROSCOPIC
Bilirubin Urine: NEGATIVE
Glucose, UA: NEGATIVE mg/dL
Ketones, ur: NEGATIVE mg/dL
Nitrite: NEGATIVE
Protein, ur: NEGATIVE mg/dL
Specific Gravity, Urine: 1.006 (ref 1.005–1.030)
pH: 6 (ref 5.0–8.0)

## 2020-04-14 LAB — CBC
HCT: 33.9 % — ABNORMAL LOW (ref 36.0–46.0)
Hemoglobin: 10.5 g/dL — ABNORMAL LOW (ref 12.0–15.0)
MCH: 22.3 pg — ABNORMAL LOW (ref 26.0–34.0)
MCHC: 31 g/dL (ref 30.0–36.0)
MCV: 72 fL — ABNORMAL LOW (ref 80.0–100.0)
Platelets: 395 10*3/uL (ref 150–400)
RBC: 4.71 MIL/uL (ref 3.87–5.11)
RDW: 19.3 % — ABNORMAL HIGH (ref 11.5–15.5)
WBC: 6.5 10*3/uL (ref 4.0–10.5)
nRBC: 0 % (ref 0.0–0.2)

## 2020-04-14 LAB — COMPREHENSIVE METABOLIC PANEL
ALT: 13 U/L (ref 0–44)
AST: 17 U/L (ref 15–41)
Albumin: 3.4 g/dL — ABNORMAL LOW (ref 3.5–5.0)
Alkaline Phosphatase: 61 U/L (ref 38–126)
Anion gap: 6 (ref 5–15)
BUN: 7 mg/dL (ref 6–20)
CO2: 26 mmol/L (ref 22–32)
Calcium: 9.5 mg/dL (ref 8.9–10.3)
Chloride: 100 mmol/L (ref 98–111)
Creatinine, Ser: 0.82 mg/dL (ref 0.44–1.00)
GFR, Estimated: >60 mL/min (ref >60)
Glucose, Bld: 112 mg/dL — ABNORMAL HIGH (ref 70–99)
Potassium: 3.9 mmol/L (ref 3.5–5.1)
Sodium: 132 mmol/L — ABNORMAL LOW (ref 135–145)
Total Bilirubin: 0.2 mg/dL — ABNORMAL LOW (ref 0.3–1.2)
Total Protein: 6.7 g/dL (ref 6.5–8.1)

## 2020-04-14 LAB — LIPASE, BLOOD: Lipase: 25 U/L (ref 11–51)

## 2020-04-14 NOTE — ED Triage Notes (Signed)
Pt reports since Saturday has been having RLQ pain. Seen by UC on 12/26, had UA done neg.  Has taken meloxicam with little relief. Denies recent fever, vomiting, diarrhea. Has had some nausea. No distress noted at present.

## 2020-04-15 ENCOUNTER — Other Ambulatory Visit: Payer: Self-pay

## 2020-04-15 ENCOUNTER — Ambulatory Visit (HOSPITAL_COMMUNITY)
Admission: EM | Admit: 2020-04-15 | Discharge: 2020-04-15 | Disposition: A | Discharge status: Home / Self Care | Payer: BC Managed Care – PPO | Attending: Family Medicine | Admitting: Family Medicine

## 2020-04-15 ENCOUNTER — Encounter (HOSPITAL_COMMUNITY): Payer: Self-pay | Admitting: Emergency Medicine

## 2020-04-15 DIAGNOSIS — R1030 Lower abdominal pain, unspecified: Secondary | ICD-10-CM

## 2020-04-15 LAB — POC URINE PREG, ED: Preg Test, Ur: NEGATIVE

## 2020-04-15 MED ORDER — ONDANSETRON 4 MG PO TBDP: 4.0000 mg | ORAL_TABLET | Freq: Three times a day (TID) | ORAL | 0 refills | Status: AC | PRN

## 2020-04-15 MED ORDER — ONDANSETRON 4 MG PO TBDP: 4.0000 mg | ORAL_TABLET | Freq: Three times a day (TID) | ORAL | 0 refills | Status: DC | PRN

## 2020-04-15 MED ORDER — CIPROFLOXACIN HCL 250 MG PO TABS: 250.0000 mg | ORAL_TABLET | Freq: Two times a day (BID) | ORAL | 0 refills | Status: AC

## 2020-04-15 NOTE — ED Triage Notes (Signed)
Right lower abdominal pain onset Saturday.  Reports pain as constant.  Patient reports nausea, no vomiting.  Last BM was this am and reported as normal.  Patient states urinating more often, feels need, but little output.  Discharge started 12/26.  Patient was seen for discharge, no treatment-told everything was clear.  Went to ed last night-left prior to being seen.  Urine and blood work done.

## 2020-04-15 NOTE — ED Notes (Addendum)
Patient returned her stickers as she left.

## 2020-04-15 NOTE — ED Provider Notes (Addendum)
[facility]   [id number] 04/15/20 Arrival Time: 1005  ASSESSMENT & PLAN:  1. Lower abdominal pain   - with mild nausea.  Benign abdominal exam. No indications for urgent abdominal/pelvic imaging at this time. Discussed. Will treat for UTI. No signs of pyelonephritis. Labs from ED reviewed.   Meds ordered this encounter  Medications  . ciprofloxacin (CIPRO) 250 MG tablet    Sig: Take 1 tablet (250 mg total) by mouth every 12 (twelve) hours.    Dispense:  10 tablet    Refill:  0  . ondansetron (ZOFRAN-ODT) 4 MG disintegrating tablet    Sig: Take 1 tablet (4 mg total) by mouth every 8 (eight) hours as needed for nausea or vomiting.    Dispense:  15 tablet    Refill:  0     Discharge Instructions     You have been seen today for abdominal pain. Your evaluation was not suggestive of any emergent condition requiring medical intervention at this time. However, some abdominal problems make take more time to appear. Therefore, it is very important for you to pay attention to any new symptoms or worsening of your current condition.  Please return here or to the Emergency Department immediately should you begin to feel worse in any way or have any of the following symptoms: increasing or different abdominal pain, persistent vomiting, inability to drink fluids, fevers, or shaking chills.       Follow-up Information    [facility] EMERGENCY DEPARTMENT.   Specialty: Emergency Medicine Why: If worsening or failing to improve as anticipated. Contact information: [address] [id number] mc [place] [address] [phone]               Reviewed expectations re: course of current medical issues. Questions answered. Outlined signs and symptoms indicating need for more acute intervention. Patient verbalized understanding. After Visit Summary given.   SUBJECTIVE: History from: patient. Meghan Bailey is a 20 y.o. female who  presents with complaint of intermittent abdominal discomfort; lower; over past week. Mild nausea without emesis. No fevers. Does reports urinary frequency and mild dysuria. Reports she is not sexually active. No concern for STD. Ambulatory without difficulty. Normal PO intake without emesis.  Patient's last menstrual period was 03/28/2020 (approximate).   Past Surgical History:  Procedure Laterality Date  . WISDOM TOOTH EXTRACTION       OBJECTIVE:  Vitals:   04/15/20 1031  BP: 106/69  Pulse: 92  Resp: 20  Temp: 98.9 F (37.2 C)  TempSrc: Oral  SpO2: 100%    General appearance: alert, oriented, no acute distress HEENT: [place]; AT; oropharynx moist Lungs: unlabored respirations Abdomen: soft; without distention; no specific tenderness to palpation; without masses or organomegaly; without guarding or rebound tenderness Back: without reported CVA tenderness; FROM at waist Extremities: without LE edema; symmetrical; without gross deformities Skin: warm and dry Neurologic: normal gait Psychological: alert and cooperative; normal mood and affect  Labs: Results for orders placed or performed during the hospital encounter of 04/15/20  POC urine pregnancy  Result Value Ref Range   Preg Test, Ur NEGATIVE NEGATIVE   Labs Reviewed  POC URINE PREG, ED  POC URINE PREG, ED     Allergies  Allergen Reactions  . Amoxicillin Anaphylaxis  . Clindamycin/Lincomycin Anaphylaxis    "closes her throat up"  . Keflex [Cephalexin] Anaphylaxis  . Tessalon [Benzonatate] Anaphylaxis  . Penicillins Itching and Rash  . Sulfa Antibiotics Hives and Rash  Past Medical History:  Diagnosis Date  . Asthma   . IBS (irritable bowel syndrome)     Social History   Socioeconomic History  . Marital status: Married    Spouse name: Not on file  . Number of children: Not on file  . Years of education: Not on file  . Highest education level: Not on file   Occupational History  . Not on file  Tobacco Use  . Smoking status: Never Smoker  . Smokeless tobacco: Never Used  Vaping Use  . Vaping Use: Never used  Substance and Sexual Activity  . Alcohol use: Yes  . Drug use: Never  . Sexual activity: Not on file  Other Topics Concern  . Not on file  Social History Narrative  . Not on file   Social Determinants of Health   Financial Resource Strain: Not on file  Food Insecurity: Not on file  Transportation Needs: Not on file  Physical Activity: Not on file  Stress: Not on file  Social Connections: Not on file  Intimate Partner Violence: Not on file    Family History  Problem Relation Age of Onset  . Hypertension Mother   . Hypertension Father      Mardella Layman, MD 04/15/20 9179    Mardella Layman, MD 04/15/20 [phone]

## 2020-04-15 NOTE — Discharge Instructions (Addendum)

## 2020-04-29 NOTE — Progress Notes (Signed)
   Covid-19 Vaccination Clinic  Name:  Meghan Bailey    MRN: [id number] DOB: 12-14-2000  04/29/2020  Meghan Bailey was observed post Covid-19 immunization for 15 minutes without incident. She was provided with Vaccine Information Sheet and instruction to access the V-Safe system.   Meghan Bailey was instructed to call 911 with any severe reactions post vaccine: Marland Kitchen Difficulty breathing  . Swelling of face and throat  . A fast heartbeat  . A bad rash all over body  . Dizziness and weakness   Immunizations Administered    Name Date Dose VIS Date Route   Pfizer COVID-19 Vaccine 02/16/2020  2:00 PM 0.3 mL 02/02/2020 Intramuscular   Manufacturer: [employer], [employer]   Lot: [id number]   NDC: [address]-2

## 2021-01-13 IMAGING — US US PELVIS COMPLETE TRANSABD/TRANSVAG W DUPLEX
1 series · 13 of 25 positions shown · non-contrast
Comparison: None.

CLINICAL DATA: Right lower quadrant pain x1 day

EXAM:
TRANSABDOMINAL AND TRANSVAGINAL ULTRASOUND OF PELVIS
DOPPLER ULTRASOUND OF OVARIES
TECHNIQUE: Both transabdominal and transvaginal ultrasound examinations of the
pelvis were performed. Transabdominal technique was performed for
global imaging of the pelvis including uterus, ovaries, adnexal
regions, and pelvic cul-de-sac.
It was necessary to proceed with endovaginal exam following the
transabdominal exam to visualize the endometrium and right ovary.
Color and duplex Doppler ultrasound was utilized to evaluate blood
flow to the ovaries.

[Series 1: us pelvis complete transabd/transvag w duplex · 116 acquisitions, 13 frames shown]
[im 1/116]
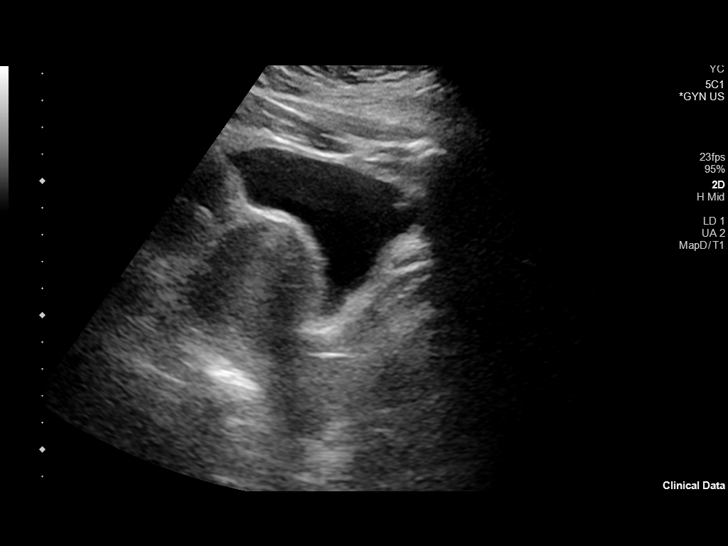
[im 10/116]
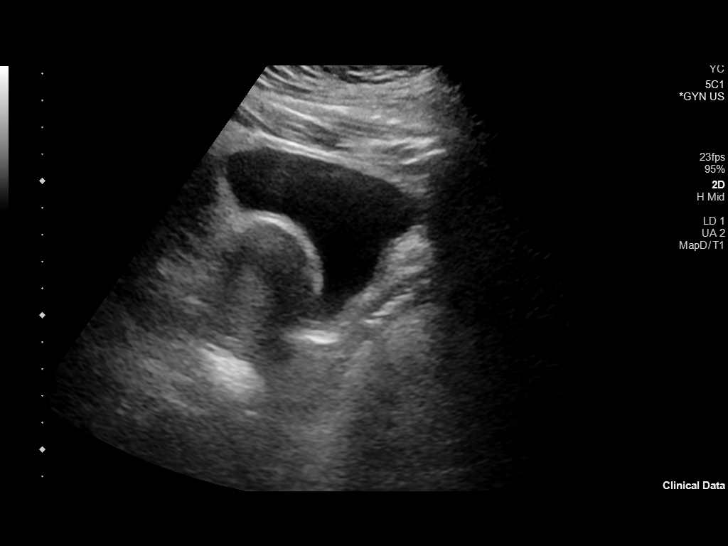
[im 20/116]
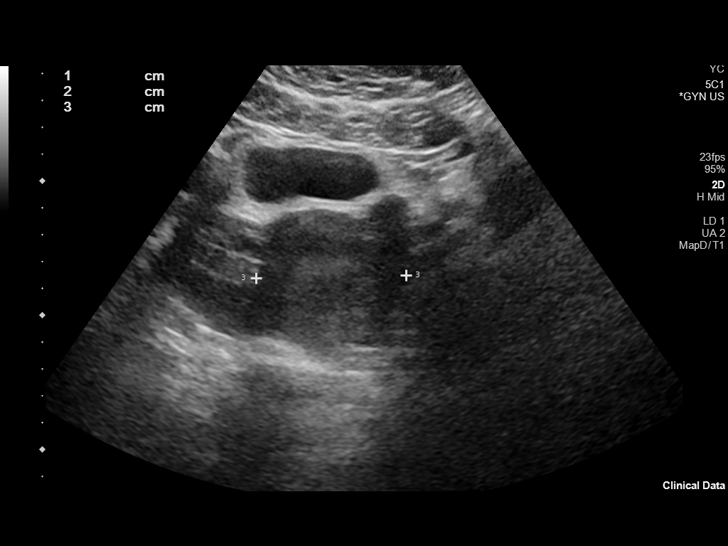
[im 29/116]
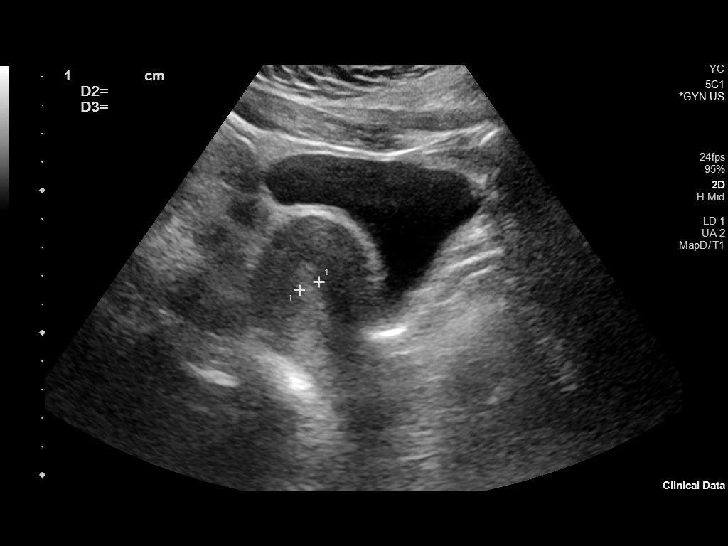
[im 39/116]
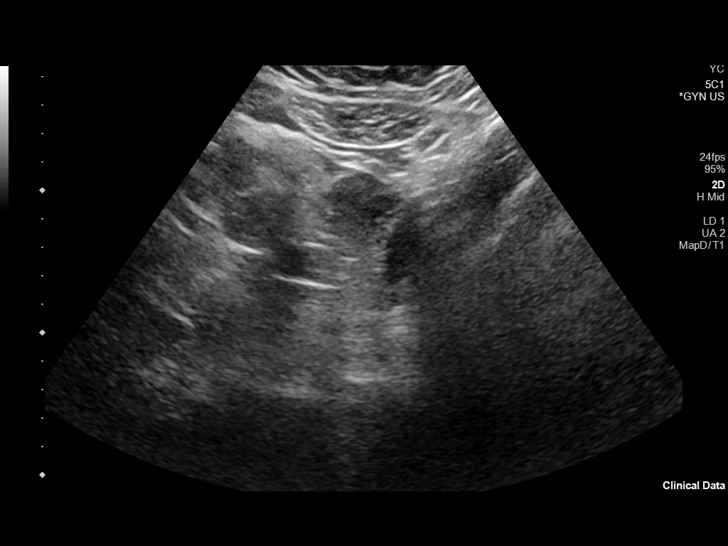
[im 48/116]
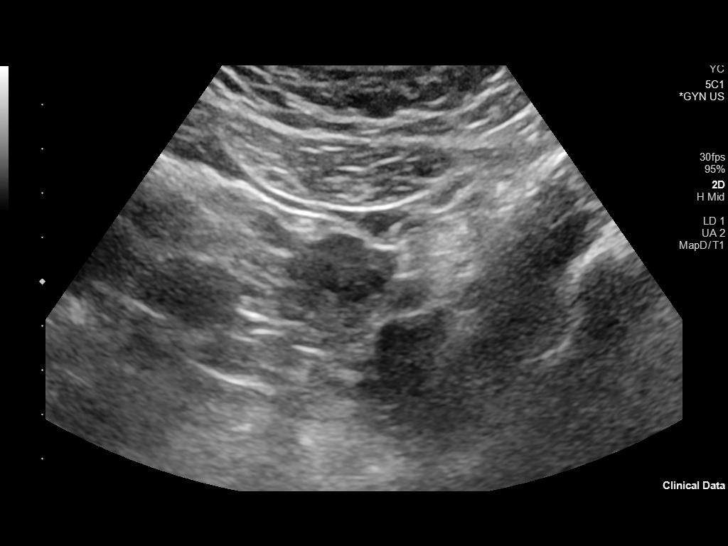
[im 58/116]
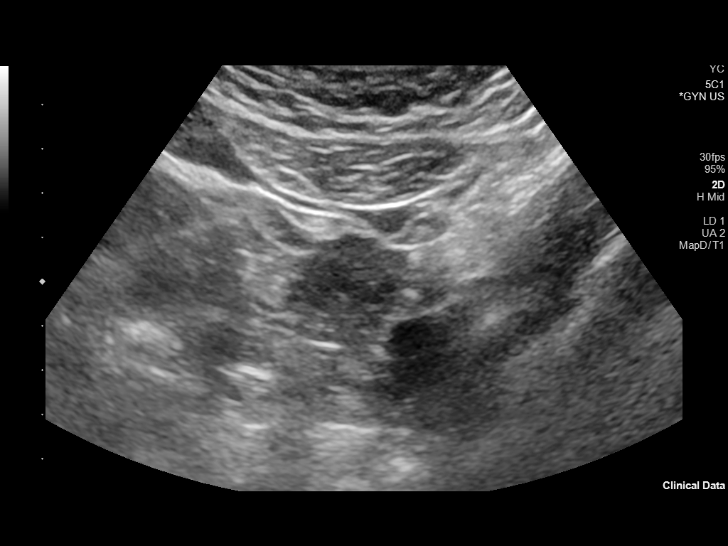
[im 68/116]
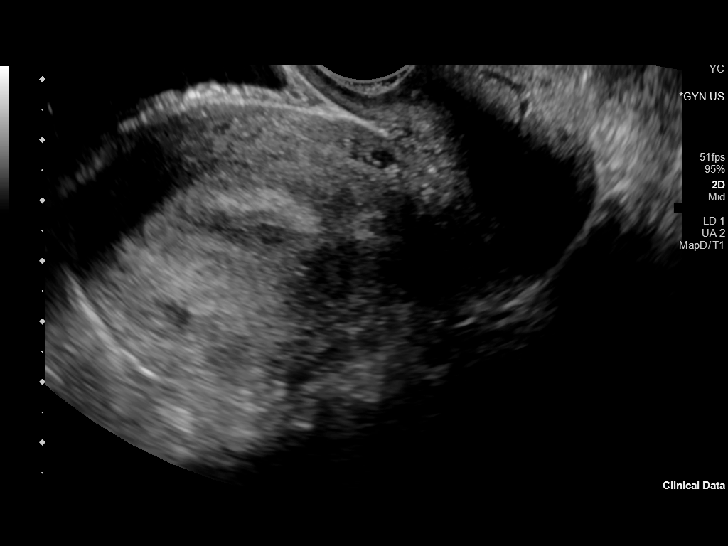
[im 77/116]
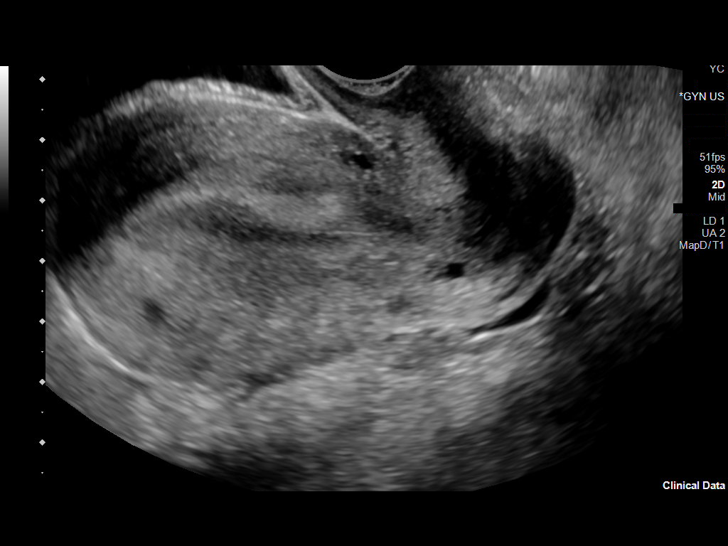
[im 87/116]
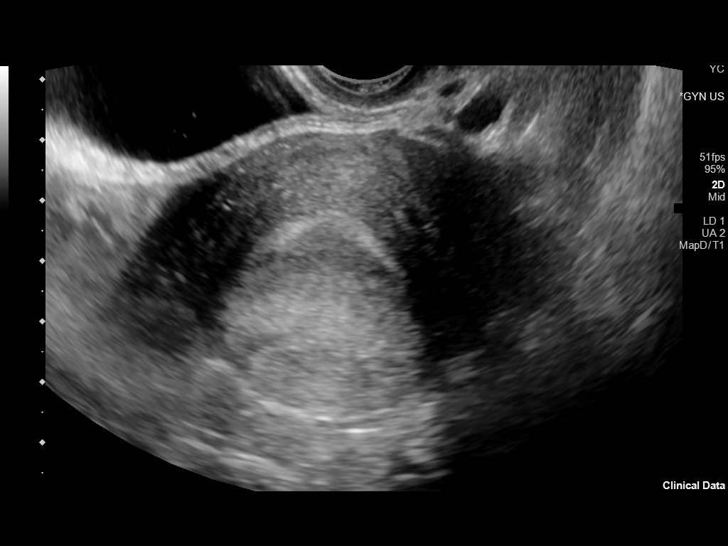
[im 96/116]
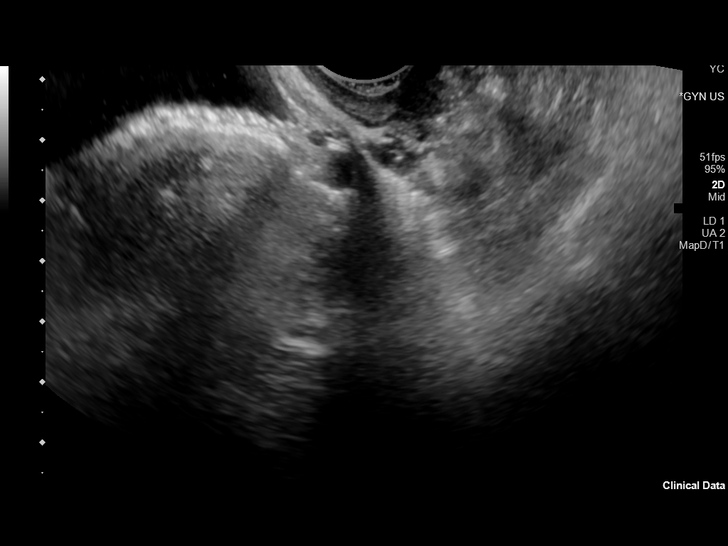
[im 106/116]
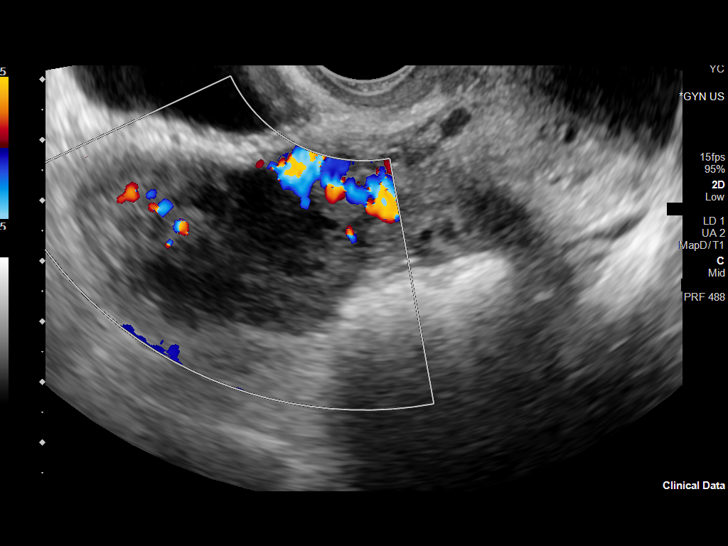
[im 116/116]
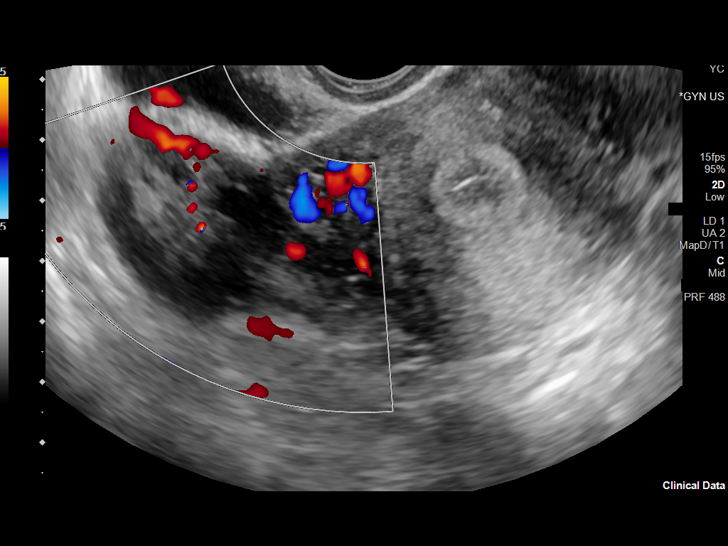

[13 of 25 positions shown; findings below may reference images not displayed]

FINDINGS: Uterus

Measurements: 8.9 x 4.9 x 5.3 cm = volume: 119 mL. No fibroids or
other mass visualized.

Endometrium

Thickness: 15 mm.  No focal abnormality visualized.

Right ovary

Measurements: 5.0 x 2.7 x 3.0 cm = volume: 20.5 mL. Normal
appearance/no adnexal mass.

Left ovary

Measurements: 2.7 x 2.1 x 2.6 cm = volume: 7.6 mL. Normal
appearance/no adnexal mass.

Pulsed Doppler evaluation of both ovaries demonstrates normal
low-resistance arterial and venous waveforms.

Other findings

No abnormal free fluid.
IMPRESSION: Negative pelvic ultrasound.

No evidence of ovarian torsion.

## 2021-01-13 IMAGING — CT CT ABD-PELV W/ CM
2 of 4 series · 16 of 46 positions shown, 18 images · IV contrast (omnipaque)
Comparison: None.

CLINICAL DATA: Right lower quadrant abdominal pain since this
morning

EXAM:
CT ABDOMEN AND PELVIS WITH CONTRAST
TECHNIQUE: Multidetector CT imaging of the abdomen and pelvis was performed
using the standard protocol following bolus administration of
intravenous contrast.
CONTRAST:  100mL OMNIPAQUE IOHEXOL 300 MG/ML  SOLN

[Series 2: axial st · axial · 0.66mm/px · z∈[+1122,+1552]mm · 13 of 98 slices shown, 15 images]
[im 6/98  soft-tissue]
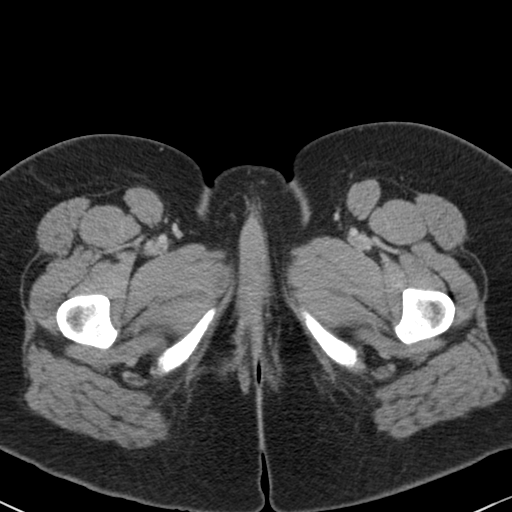
[im 6/98  bone]
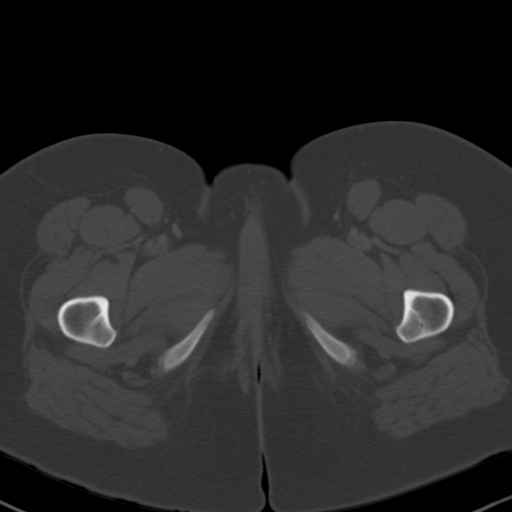
[im 16/98  soft-tissue]
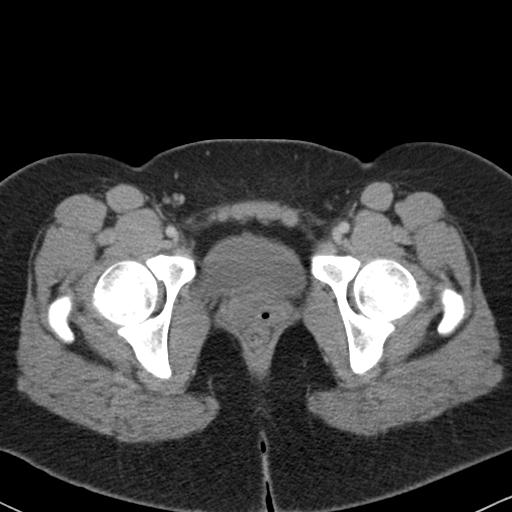
[im 21/98  soft-tissue]
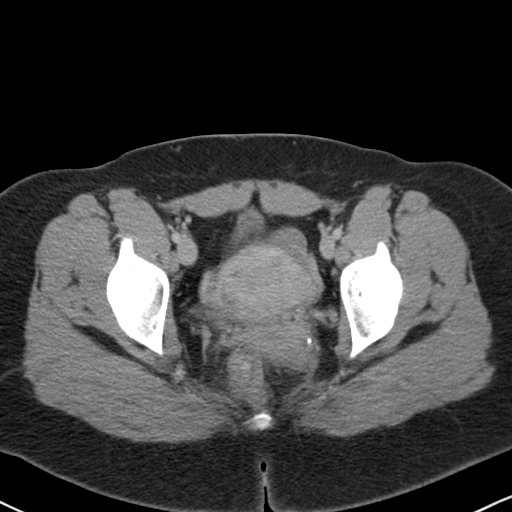
[im 26/98  soft-tissue]
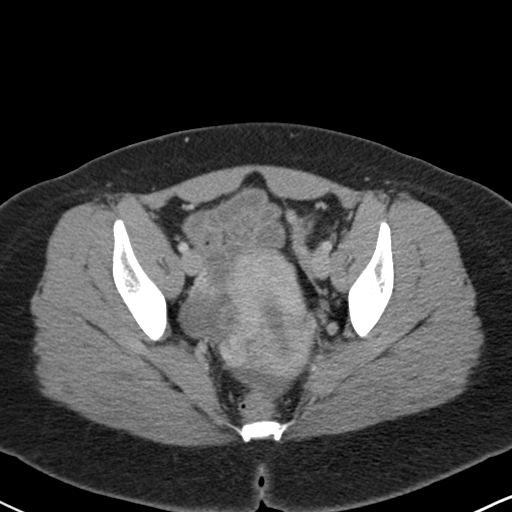
[im 36/98  soft-tissue]
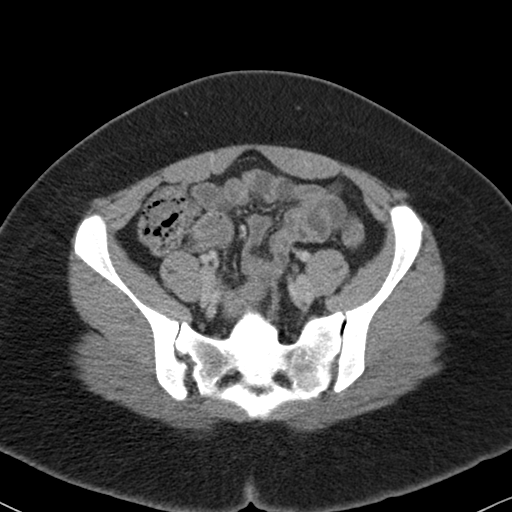
[im 41/98  soft-tissue]
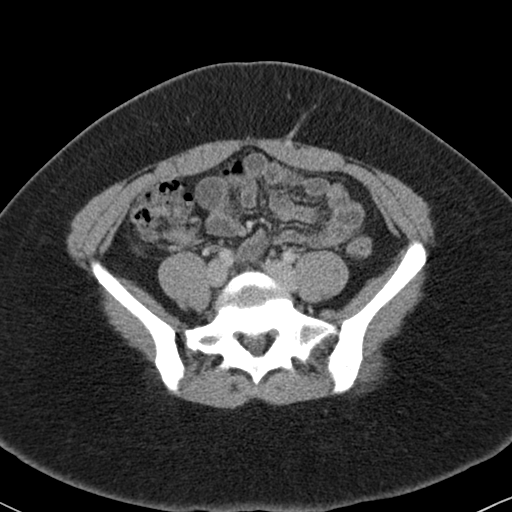
[im 52/98  soft-tissue]
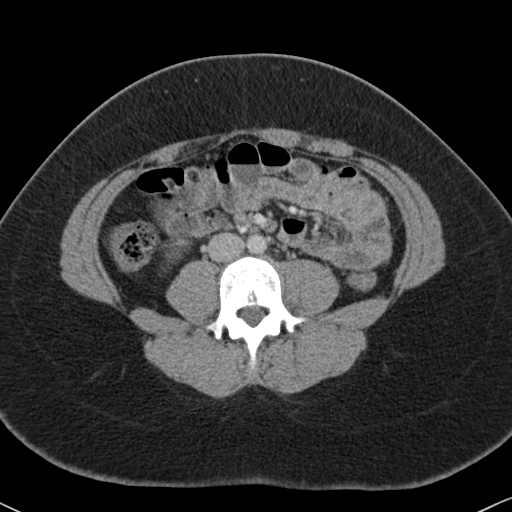
[im 57/98  soft-tissue]
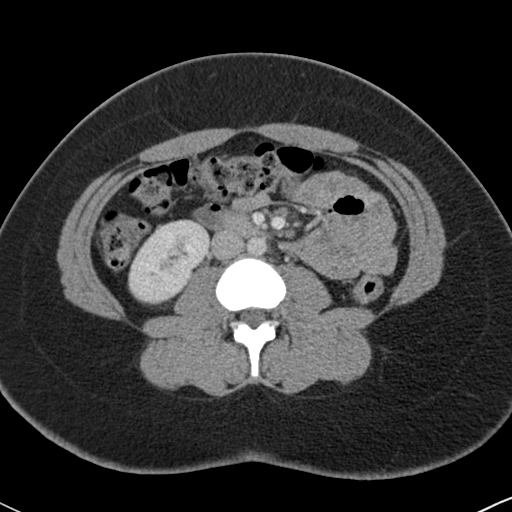
[im 62/98  soft-tissue]
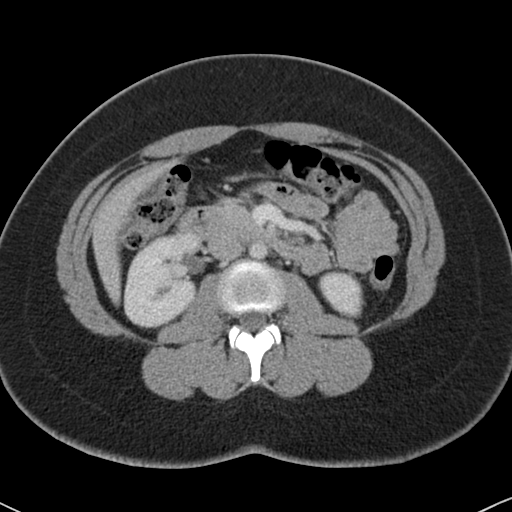
[im 62/98  bone]
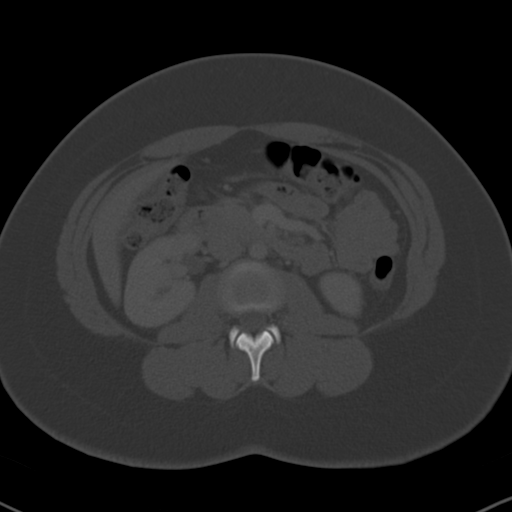
[im 72/98  soft-tissue]
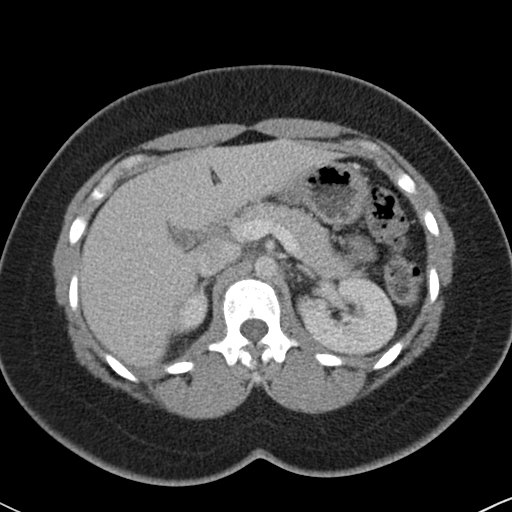
[im 77/98  soft-tissue]
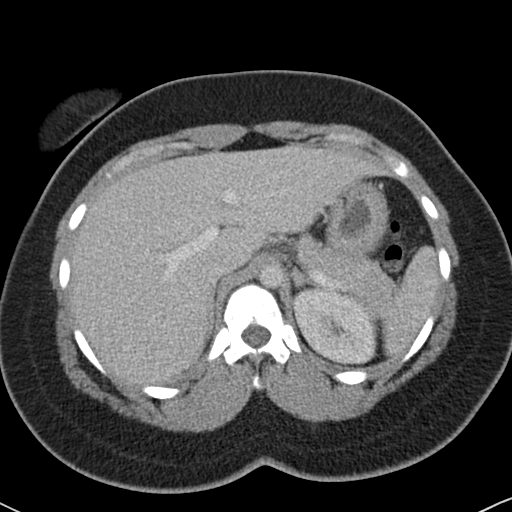
[im 82/98  soft-tissue]
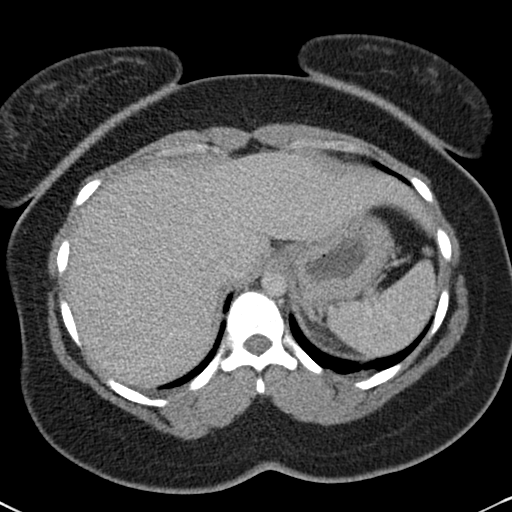
[im 92/98  soft-tissue]
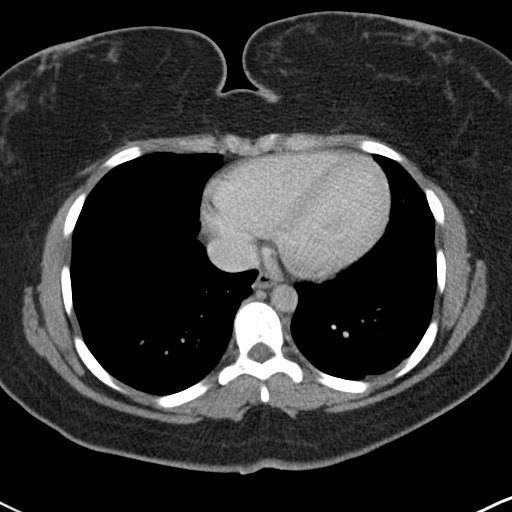

[Series 5: coronal st · coronal · 0.69mm/px · 3 of 139 slices shown]
[im 47/139  soft-tissue]
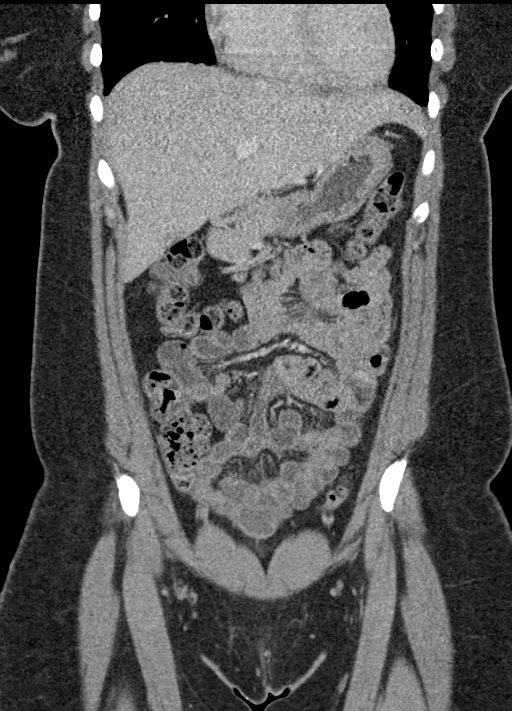
[im 62/139  soft-tissue]
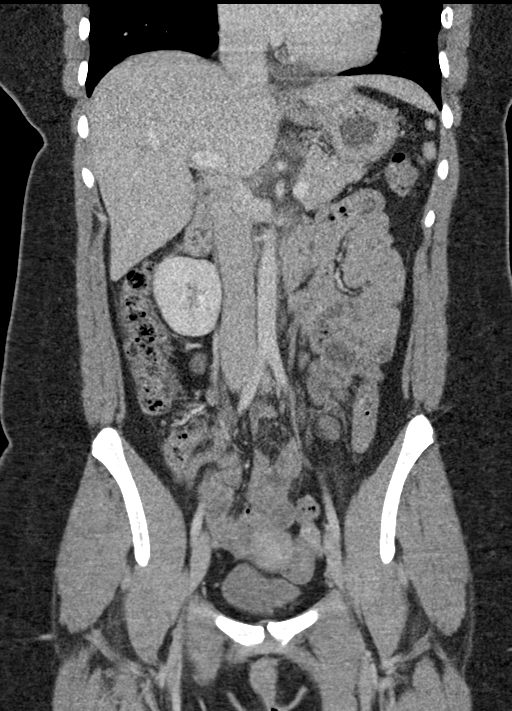
[im 77/139  soft-tissue]
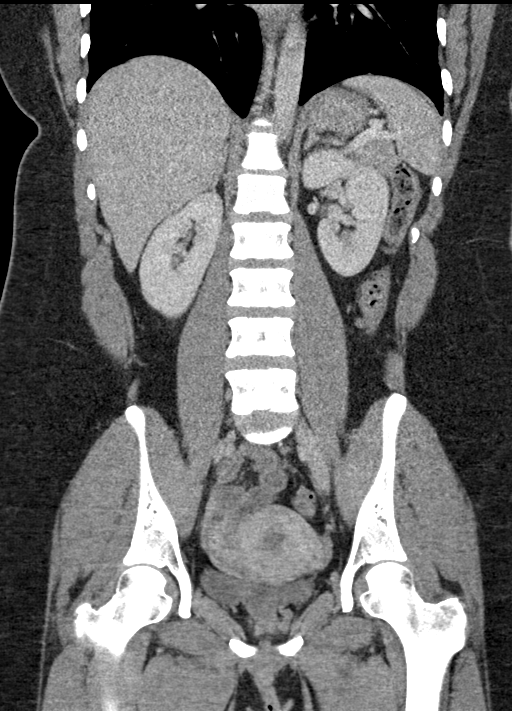

[16 of 46 positions shown; findings below may reference images not displayed]

FINDINGS: Lower chest: No acute pleural or parenchymal lung disease.

Hepatobiliary: No focal liver abnormality is seen. No gallstones,
gallbladder wall thickening, or biliary dilatation.

Pancreas: Unremarkable. No pancreatic ductal dilatation or
surrounding inflammatory changes.

Spleen: Normal in size without focal abnormality.

Adrenals/Urinary Tract: Adrenal glands are unremarkable. Kidneys are
normal, without renal calculi, focal lesion, or hydronephrosis.
Bladder is unremarkable.

Stomach/Bowel: Stomach is within normal limits. Appendix appears
normal. No evidence of bowel wall thickening, distention, or
inflammatory changes.

Vascular/Lymphatic: No significant vascular findings are present. No
enlarged abdominal or pelvic lymph nodes.

Reproductive: Uterus and bilateral adnexa are unremarkable.

Other: Trace pelvic free fluid is likely physiologic. No evidence of
abdominal wall hernia.

Musculoskeletal: No acute or destructive bony lesions. Reconstructed
images demonstrate no additional findings.
IMPRESSION: 1. No acute intra-abdominal or intrapelvic process. Normal appendix.
2. Trace pelvic free fluid is likely physiologic in this
premenopausal patient.
